# Patient Record
Sex: Male | Born: 1995 | Race: White | Hispanic: No | Marital: Single | Smoking: Never smoker
Health system: Southern US, Community
[De-identification: ages and names within clinical notes are randomized; demographics above are authoritative.]

## PROBLEM LIST (undated history)

## (undated) HISTORY — PX: APPENDECTOMY: SHX54

---
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
## 2015-05-10 ENCOUNTER — Other Ambulatory Visit: Payer: Self-pay | Admitting: Specialist

## 2015-05-10 DIAGNOSIS — N442 Benign cyst of testis: Secondary | ICD-10-CM

## 2015-05-13 ENCOUNTER — Ambulatory Visit
Admission: RE | Admit: 2015-05-13 | Discharge: 2015-05-13 | Disposition: A | Discharge status: Home / Self Care | Payer: BLUE CROSS/BLUE SHIELD | Source: Ambulatory Visit | Attending: Specialist | Admitting: Specialist

## 2015-05-13 DIAGNOSIS — N442 Benign cyst of testis: Secondary | ICD-10-CM | POA: Diagnosis present

## 2016-09-23 IMAGING — US US SCROTUM
2 series · 13 of 25 positions shown · non-contrast
Comparison: None in PACs

CLINICAL DATA: Palpable nontender nodule in the left aspect of
scrotum for the past 5 days.

EXAM:
SCROTAL ULTRASOUND
DOPPLER ULTRASOUND OF THE TESTICLES
TECHNIQUE: Complete ultrasound examination of the testicles, epididymis, and
other scrotal structures was performed. Color and spectral Doppler
ultrasound were also utilized to evaluate blood flow to the
testicles.

[Series 1: us scrotum · 0.07mm/px · 12 of 67 slices shown (1 of 2)]
[im 1/67]
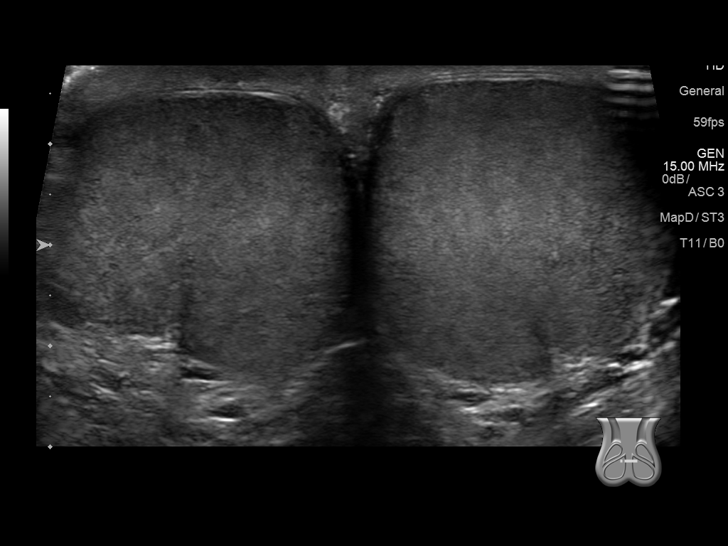
[im 6/67]
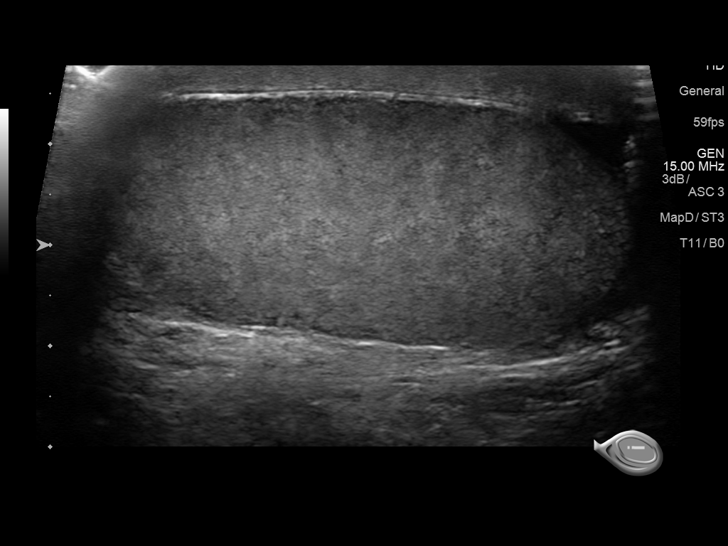
[im 12/67]
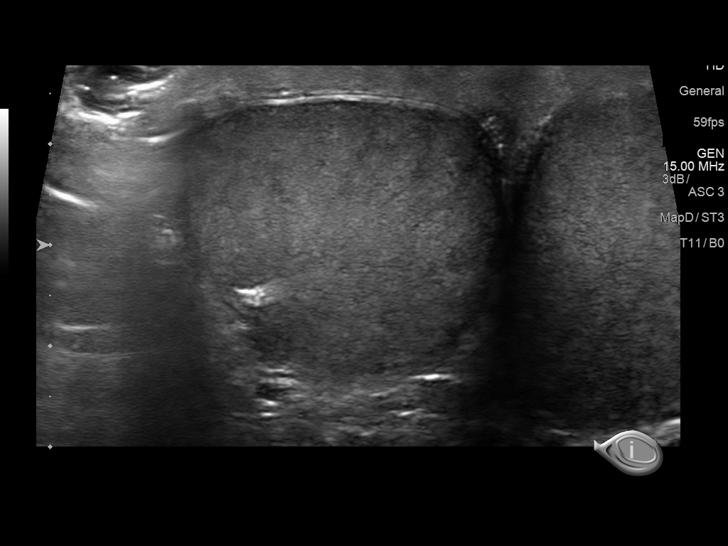
[im 18/67]
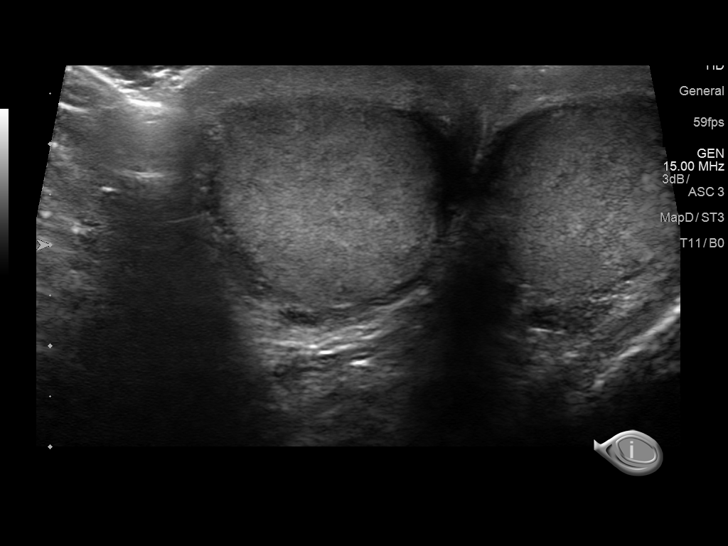
[im 23/67]
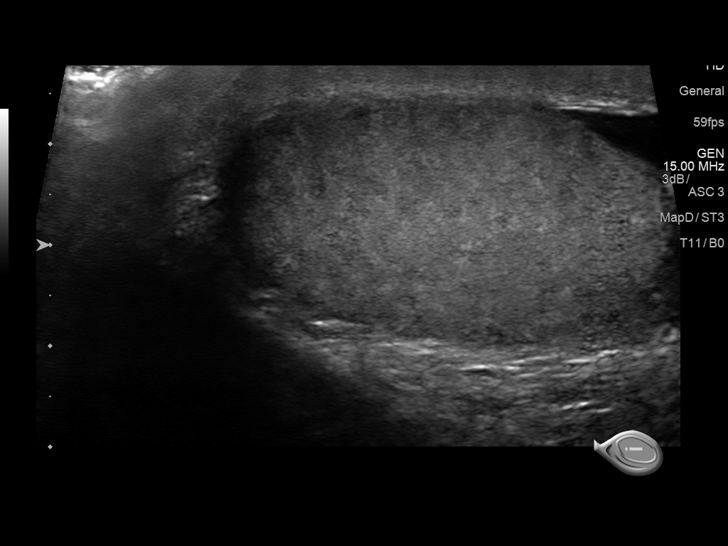
[im 29/67]
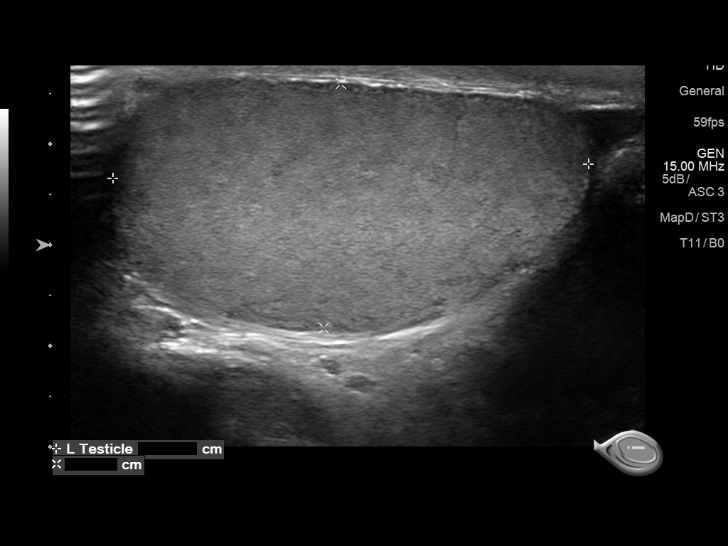
[im 35/67]
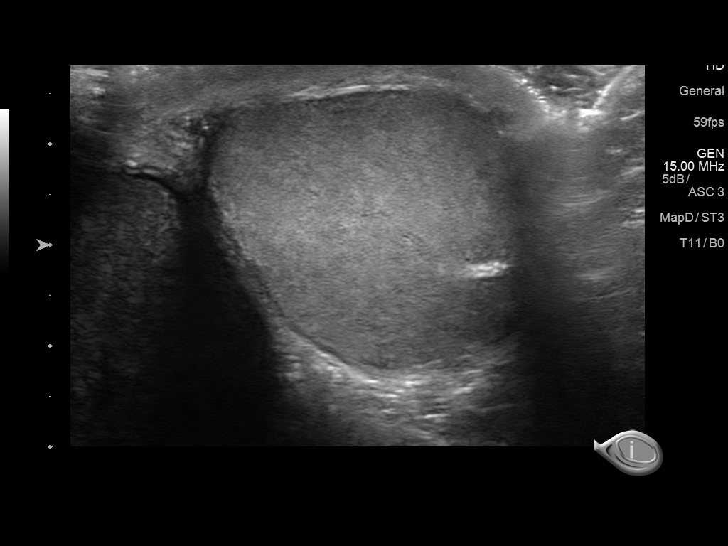
[im 41/67]
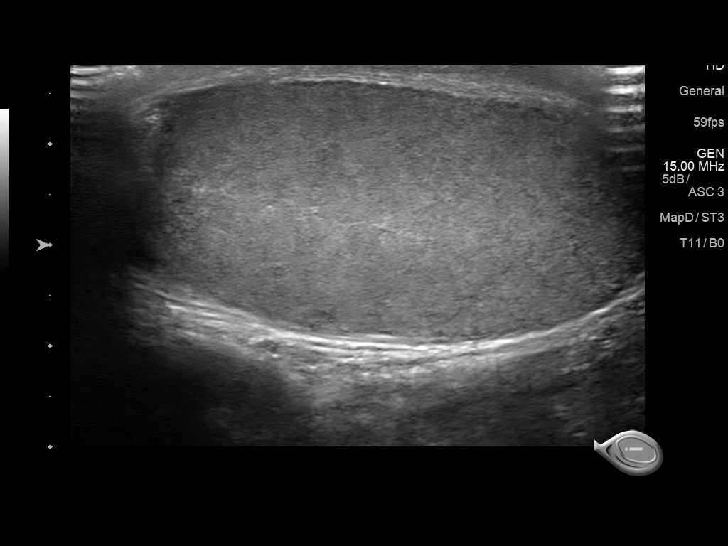
[im 46/67]
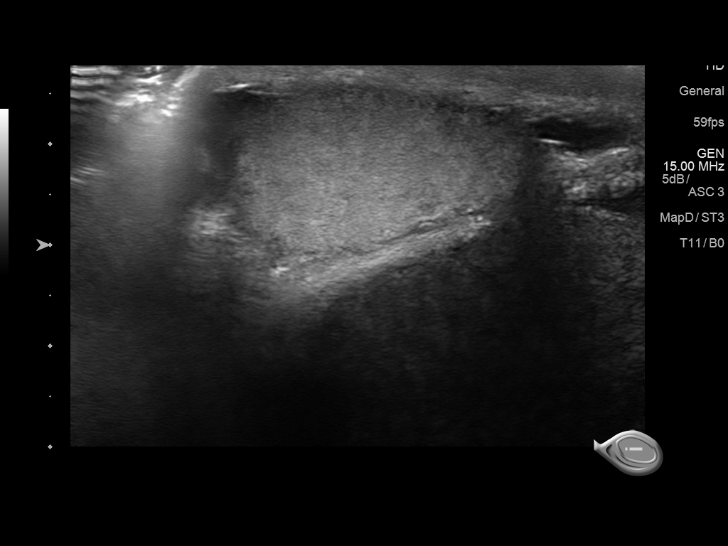
[im 52/67]
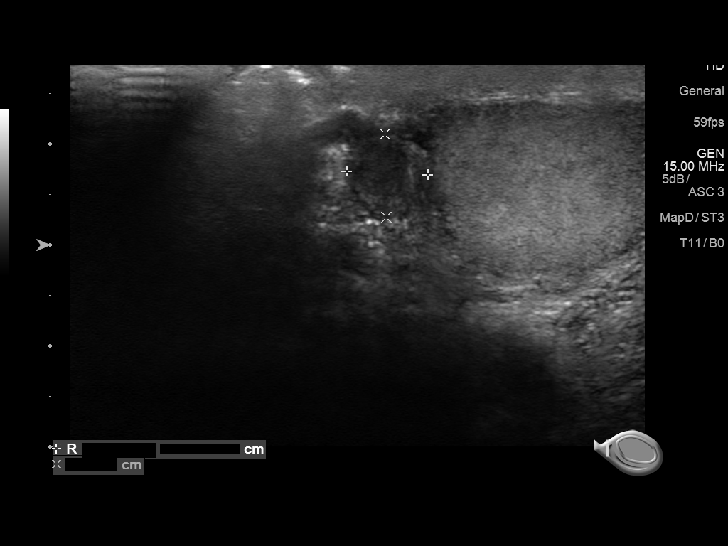
[im 58/67]
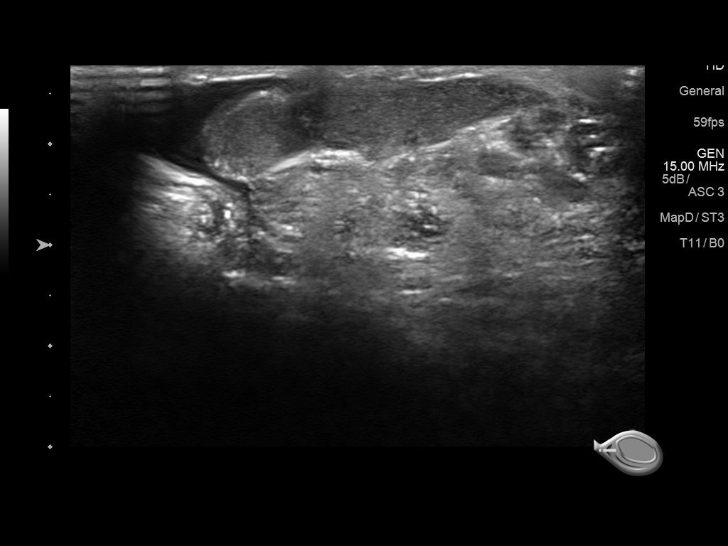
[im 64/67]
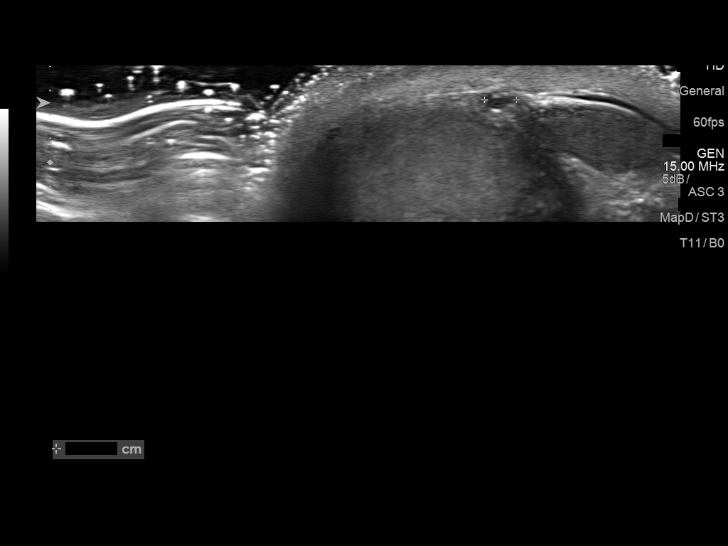

[Series 2: us scrotum · 0.06mm/px · 1 of 1 slices shown (2 of 2)]
[im 1/1]
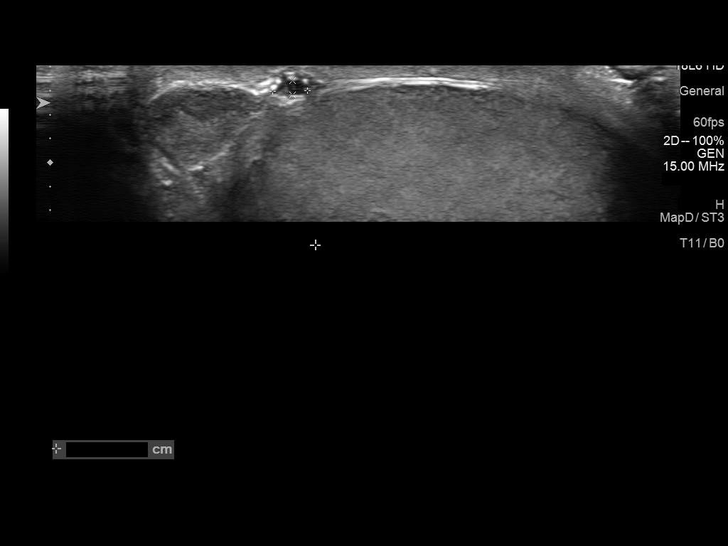

[13 of 25 positions shown; findings below may reference images not displayed]

FINDINGS: Right testicle

Measurements: 5.4 x 2.4 x 3.1 cm. No mass or microlithiasis
visualized.

Left testicle

Measurements: 4.7 x 2.4 x 3.5 cm. No mass or microlithiasis
visualized.

Right epididymis: Normal in size and appearance. There is a 3 mm
diameter anechoic structure likely arising from the epididymis most
compatible with a cyst. Vascularity surrounding this is not
increased. There is no evidence of acute epididymitis.

Left epididymis:  Normal in size and appearance.

Hydrocele:  None visualized.

Varicocele:  None visualized.

Pulsed Doppler interrogation of both testes demonstrates normal low
resistance arterial and venous waveforms bilaterally.
IMPRESSION: 1. There is no intra testicular mass nor evidence of orchitis or
torsion.
2. 3 mm right-sided epididymis which corresponds to the patient's
palpable finding. There is no evidence of acute epididymitis.
3. The left epididymis is unremarkable.

## 2017-06-28 ENCOUNTER — Ambulatory Visit
Admission: EM | Admit: 2017-06-28 | Discharge: 2017-06-28 | Disposition: A | Discharge status: Home / Self Care | Payer: BLUE CROSS/BLUE SHIELD | Attending: Family Medicine | Admitting: Family Medicine

## 2017-06-28 ENCOUNTER — Encounter: Payer: Self-pay | Admitting: *Deleted

## 2017-06-28 DIAGNOSIS — R509 Fever, unspecified: Secondary | ICD-10-CM

## 2017-06-28 DIAGNOSIS — R05 Cough: Secondary | ICD-10-CM

## 2017-06-28 DIAGNOSIS — J111 Influenza due to unidentified influenza virus with other respiratory manifestations: Secondary | ICD-10-CM

## 2017-06-28 DIAGNOSIS — M791 Myalgia, unspecified site: Secondary | ICD-10-CM | POA: Diagnosis not present

## 2017-06-28 DIAGNOSIS — R69 Illness, unspecified: Secondary | ICD-10-CM | POA: Diagnosis not present

## 2017-06-28 DIAGNOSIS — R0981 Nasal congestion: Secondary | ICD-10-CM | POA: Diagnosis not present

## 2017-06-28 MED ORDER — OSELTAMIVIR PHOSPHATE 75 MG PO CAPS: 75.0000 mg | ORAL_CAPSULE | Freq: Two times a day (BID) | ORAL | 0 refills | Status: DC

## 2017-06-28 NOTE — ED Provider Notes (Signed)
[facility] ____________________________________________  Time seen: Approximately 10:05 AM  I have reviewed the triage vital signs and the nursing notes.   HISTORY  Chief Complaint Cough; Fever; and Generalized Body Aches   HPI Ray Nolan is a 22 y.o. male present for evaluation of runny nose, nasal congestion, cough, chills, body aches with accompanying low-grade fever that started last night.  Reports yesterday during the day he felt fine.  States T-max last night was 100.  He does not take any over-the-counter medications for the same complaints.  Reports continues to eat and drink well.  Denies sore throat.  Denies known sick contacts.  Denies recent sickness.  Denies chronic medical problems.  Denies other complaints. Denies recent antibiotic use.    History reviewed. No pertinent past medical history.  There are no active problems to display for this patient.   History reviewed. No pertinent surgical history.   No current facility-administered medications for this encounter.   Current Outpatient Medications:  .  oseltamivir (TAMIFLU) 75 MG capsule, Take 1 capsule (75 mg total) by mouth every 12 (twelve) hours., Disp: 10 capsule, Rfl: 0  Allergies Patient has no known allergies.  Family History  Problem Relation Age of Onset  . Healthy Mother     Social History Social History   Tobacco Use  . Smoking status: Never Smoker  . Smokeless tobacco: Never Used  Substance Use Topics  . Alcohol use: Yes  . Drug use: No    Review of Systems Constitutional:  as above. ENT: No sore throat. Cardiovascular: Denies chest pain. Respiratory: Denies shortness of breath. Gastrointestinal: No abdominal pain.  No nausea, no vomiting.  No diarrhea.   Musculoskeletal: Negative for back pain. Skin: Negative for rash.   ____________________________________________   PHYSICAL EXAM:  VITAL SIGNS: ED Triage Vitals  Enc Vitals Group     BP 06/28/17 0936  115/68     Pulse Rate 06/28/17 0936 78     Resp 06/28/17 0936 16     Temp 06/28/17 0936 98.7 F (37.1 C)     Temp Source 06/28/17 0936 Oral     SpO2 06/28/17 0936 100 %     Weight 06/28/17 0938 185 lb (83.9 kg)     Height 06/28/17 0938 6\' 2"  (1.88 m)     Head Circumference --      Peak Flow --      Pain Score 06/28/17 1007 2     Pain Loc --      Pain Edu? --      Excl. in GC? --    Constitutional: Alert and oriented. Well appearing and in no acute distress. Eyes: Conjunctivae are normal.  Head: Atraumatic. No sinus tenderness to palpation. No swelling. No erythema.  Ears: no erythema, normal TMs bilaterally.   Nose:Nasal congestion   Mouth/Throat: Mucous membranes are moist. No pharyngeal erythema. No tonsillar swelling or exudate.  Neck: No stridor.  No cervical spine tenderness to palpation. Hematological/Lymphatic/Immunilogical: No cervical lymphadenopathy. Cardiovascular: Normal rate, regular rhythm. Grossly normal heart sounds.  Good peripheral circulation. Respiratory: Normal respiratory effort.  No retractions. No wheezes, rales or rhonchi. Good air movement.  Musculoskeletal: Ambulatory with steady gait. No cervical, thoracic or lumbar tenderness to palpation. Neurologic:  Normal speech and language. No gait instability. Skin:  Skin appears warm, dry and intact. No rash noted. Psychiatric: Mood and affect are normal. Speech and behavior are normal.  ___________________________________________   LABS (all labs ordered are listed, but only abnormal results  are displayed)  Labs Reviewed - No data to display ____________________________________________   PROCEDURES Procedures    INITIAL IMPRESSION / ASSESSMENT AND PLAN / ED COURSE  Pertinent labs & imaging results that were available during my care of the patient were reviewed by me and considered in my medical decision making (see chart for details).  Well-appearing patient.  No acute distress.  Suspect  influenza-like illness.  Will treat patient with oral Tamiflu, and discussed with patient.  Encourage rest, fluids, supportive care, over-the-counter cough and congestion medications.  Work note given for today and tomorrow.Discussed indication, risks and benefits of medications with patient.  Discussed follow up with Primary care physician this week as needed. Discussed follow up and return parameters including no resolution or any worsening concerns. Patient verbalized understanding and agreed to plan.   ____________________________________________   FINAL CLINICAL IMPRESSION(S) / ED DIAGNOSES  Final diagnoses:  Influenza-like illness     ED Discharge Orders        Ordered    oseltamivir (TAMIFLU) 75 MG capsule  Every 12 hours     06/28/17 1003       Note: This dictation was prepared with Dragon dictation along with smaller phrase technology. Any transcriptional errors that result from this process are unintentional.         Renford Dills, NP 06/28/17 1023

## 2017-06-28 NOTE — ED Triage Notes (Signed)
Nonj-productive cough, fever, and body aches, onset last night.

## 2017-06-28 NOTE — Discharge Instructions (Signed)
Take medication as prescribed. Rest. Drink plenty of fluids.  ° °Follow up with your primary care physician this week as needed. Return to Urgent care for new or worsening concerns.  ° °

## 2017-07-01 ENCOUNTER — Telehealth: Payer: Self-pay | Admitting: Emergency Medicine

## 2017-07-01 NOTE — Telephone Encounter (Signed)
Called to follow up after patient's recent visit. Patient states he is much better and has returned to work.

## 2017-10-26 ENCOUNTER — Encounter: Payer: Self-pay | Admitting: Emergency Medicine

## 2017-10-26 ENCOUNTER — Other Ambulatory Visit: Payer: Self-pay

## 2017-10-26 ENCOUNTER — Ambulatory Visit
Admission: EM | Admit: 2017-10-26 | Discharge: 2017-10-26 | Disposition: A | Discharge status: Home / Self Care | Payer: BLUE CROSS/BLUE SHIELD | Attending: Nurse Practitioner | Admitting: Nurse Practitioner

## 2017-10-26 DIAGNOSIS — Z113 Encounter for screening for infections with a predominantly sexual mode of transmission: Secondary | ICD-10-CM | POA: Diagnosis not present

## 2017-10-26 LAB — URINALYSIS, ROUTINE W REFLEX MICROSCOPIC
Bilirubin Urine: NEGATIVE
Glucose, UA: NEGATIVE mg/dL
Hgb urine dipstick: NEGATIVE
KETONES UR: NEGATIVE mg/dL
LEUKOCYTES UA: NEGATIVE
NITRITE: NEGATIVE
PH: 7.5 (ref 5.0–8.0)
Protein, ur: NEGATIVE mg/dL
SPECIFIC GRAVITY, URINE: 1.015 (ref 1.005–1.030)

## 2017-10-26 LAB — CHLAMYDIA/NGC RT PCR (ARMC ONLY)
Chlamydia Tr: NOT DETECTED
N gonorrhoeae: NOT DETECTED

## 2017-10-26 NOTE — ED Provider Notes (Signed)
[facility]    CSN: [id number] Arrival date & time: 10/26/17  1740     History   Chief Complaint Chief Complaint  Patient presents with  . blood work    APPT    HPI Ray Nolan is a 22 y.o. male.   History of Present Illness  Ray Nolan is a 22 y.o. male that presents requesting STD testing.  Patient denies any dysuria, penile pain/discharge, genital lesions, testicular pain, scrotal pain, nausea, vomiting, fevers or rash. The patient does not have a history of STD's and admits to multiple sexual partners in the past.  He is heterosexual.  Patient reports frequent condom usage.  The patient has not had any prior STD/HIV testing.        History reviewed. No pertinent past medical history.  There are no active problems to display for this patient.   History reviewed. No pertinent surgical history.     Home Medications    Prior to Admission medications   Medication Sig Start Date End Date Taking? Authorizing Provider  oseltamivir (TAMIFLU) 75 MG capsule Take 1 capsule (75 mg total) by mouth every 12 (twelve) hours. 06/28/17   Renford Dills, NP    Family History Family History  Problem Relation Age of Onset  . Healthy Mother     Social History Social History   Tobacco Use  . Smoking status: Never Smoker  . Smokeless tobacco: Never Used  Substance Use Topics  . Alcohol use: Yes  . Drug use: No     Allergies   Patient has no known allergies.   Review of Systems Review of Systems  Gastrointestinal: Negative for nausea and vomiting.  Genitourinary: Negative for decreased urine volume, difficulty urinating, discharge, dysuria, flank pain, frequency, genital sores, hematuria, penile pain, penile swelling, scrotal swelling, testicular pain and urgency.  Musculoskeletal: Negative for back pain.  All other systems reviewed and are negative.    Physical Exam Triage Vital Signs ED Triage Vitals  Enc Vitals Group     BP 10/26/17 1758  129/72     Pulse Rate 10/26/17 1758 61     Resp 10/26/17 1758 18     Temp 10/26/17 1758 97.8 F (36.6 C)     Temp Source 10/26/17 1758 Oral     SpO2 10/26/17 1758 100 %     Weight 10/26/17 1801 190 lb (86.2 kg)     Height 10/26/17 1801  (1.854 m)     Head Circumference --      Peak Flow --      Pain Score 10/26/17 1800 0     Pain Loc --      Pain Edu? --      Excl. in GC? --    No data found.  Updated Vital Signs BP 129/72 (BP Location: Right Arm)   Pulse 61   Temp 97.8 F (36.6 C) (Oral)   Resp 18   Ht  (1.854 m)   Wt 190 lb (86.2 kg)   SpO2 100%   BMI 25.07 kg/m   Visual Acuity Right Eye Distance:   Left Eye Distance:   Bilateral Distance:    Right Eye Near:   Left Eye Near:    Bilateral Near:     Physical Exam  Constitutional: He is oriented to person, place, and time. He appears well-developed and well-nourished.  Neck: Normal range of motion. Neck supple.  Cardiovascular: Normal rate and regular rhythm.  Pulmonary/Chest: Effort normal and breath  sounds normal.  Musculoskeletal: Normal range of motion.  Neurological: He is alert and oriented to person, place, and time.  Skin: Skin is warm and dry.  Psychiatric: He has a normal mood and affect. His behavior is normal.     UC Treatments / Results  Labs (all labs ordered are listed, but only abnormal results are displayed) Labs Reviewed  CHLAMYDIA/NGC RT PCR ([facility] ONLY)  URINALYSIS, ROUTINE W REFLEX MICROSCOPIC  RPR  HIV ANTIBODY (ROUTINE TESTING)    EKG None  Radiology No results found.  Procedures Procedures (including critical care time)  Medications Ordered in UC Medications - No data to display  Initial Impression / Assessment and Plan / UC Course  I have reviewed the triage vital signs and the nursing notes.  Pertinent labs & imaging results that were available during my care of the patient were reviewed by me and considered in my medical decision making (see chart  for details).     22 year old male requesting STD testing.  Urinalysis negative.  Chlamydia, gonorrhea, syphilis and HIV antibody obtained.  Will notify patient of results once they become available.  Patient advised to continue safe sex practices by using condoms 100% of the time.  Evaluation has revealed no signs of a dangerous process. Discussed diagnosis with patient. Patient aware of their illness, possible red flag symptoms to watch out for and need for close follow up. Patient understands verbal and written discharge instructions. Patient comfortable with plan and disposition.  Patient a clear mental status at this time, good insight into illness (after discussion and teaching) and has clear judgment to make decisions regarding their care.  Documentation was completed with the aid of voice recognition software. Transcription may contain typographical errors.  Final Clinical Impressions(s) / UC Diagnoses   Final diagnoses:  Screening examination for STD (sexually transmitted disease)   Discharge Instructions   None    ED Prescriptions    None     Controlled Substance Prescriptions  Controlled Substance Registry consulted? Not Applicable   Lurline Idol, [place] 10/26/17 1936

## 2017-10-26 NOTE — Discharge Instructions (Addendum)
Testing for gonorrhea, chlamydia, syphilis and HIV was done today.  We will call you with the results as soon as a become available.  Continue good safe sex practices by using condoms 100% of the time.

## 2017-10-26 NOTE — ED Triage Notes (Signed)
Patient stated he need lab work for STD.

## 2017-10-28 LAB — RPR: RPR: NONREACTIVE

## 2017-10-28 LAB — HIV ANTIBODY (ROUTINE TESTING W REFLEX): HIV SCREEN 4TH GENERATION: NONREACTIVE

## 2018-04-05 ENCOUNTER — Encounter: Payer: Self-pay | Admitting: Emergency Medicine

## 2018-04-05 ENCOUNTER — Ambulatory Visit
Admission: EM | Admit: 2018-04-05 | Discharge: 2018-04-05 | Disposition: A | Discharge status: Home / Self Care | Payer: BLUE CROSS/BLUE SHIELD | Attending: Family Medicine | Admitting: Family Medicine

## 2018-04-05 ENCOUNTER — Other Ambulatory Visit: Payer: Self-pay

## 2018-04-05 DIAGNOSIS — J039 Acute tonsillitis, unspecified: Secondary | ICD-10-CM

## 2018-04-05 LAB — RAPID STREP SCREEN (MED CTR MEBANE ONLY): Streptococcus, Group A Screen (Direct): NEGATIVE

## 2018-04-05 MED ORDER — CETIRIZINE HCL 10 MG PO TABS: 10.0000 mg | ORAL_TABLET | Freq: Every day | ORAL | 0 refills | Status: AC

## 2018-04-05 MED ORDER — METHYLPREDNISOLONE 4 MG PO TBPK: ORAL_TABLET | ORAL | 0 refills | Status: AC

## 2018-04-05 NOTE — ED Triage Notes (Signed)
Pt c/o sore throat, and cough. Started about 3-4 days ago. He reports that his tonsils are inflamed.

## 2018-04-05 NOTE — Discharge Instructions (Signed)
Take medications as directed. Warm salt water gargles several times daily. Tylenol or ibuprofen as needed for pain.

## 2018-04-05 NOTE — ED Provider Notes (Signed)
[facility]    CSN: [id number] Arrival date & time: 04/05/18  1659     History   Chief Complaint Chief Complaint  Patient presents with  . Sore Throat    HPI Ray Nolan is a 22 y.o. male.   Subjective:   Ray Nolan is a 21 y.o. male who presents for evaluation of a sore throat. Associated symptoms include dry cough. Onset of symptoms was 4 days ago and has unchanged since that time.  He is drinking plenty of fluids. He has not had recent close exposure to someone with proven streptococcal pharyngitis.  The following portions of the patient's history were reviewed and updated as appropriate: allergies, current medications, past family history, past medical history, past social history, past surgical history and problem list.            History reviewed. No pertinent past medical history.  There are no active problems to display for this patient.   Past Surgical History:  Procedure Laterality Date  . APPENDECTOMY  22 years old       Home Medications    Prior to Admission medications   Medication Sig Start Date End Date Taking? Authorizing Provider  cetirizine (ZYRTEC) 10 MG tablet Take 1 tablet (10 mg total) by mouth daily. 04/05/18   Lurline Idol, FNP  methylPREDNISolone (MEDROL DOSEPAK) 4 MG TBPK tablet Take as directed 04/05/18   Lurline Idol, FNP    Family History Family History  Problem Relation Age of Onset  . Healthy Mother   . Healthy Father     Social History Social History   Tobacco Use  . Smoking status: Never Smoker  . Smokeless tobacco: Never Used  Substance Use Topics  . Alcohol use: Yes  . Drug use: No     Allergies   Patient has no known allergies.   Review of Systems Review of Systems  Constitutional: Negative for fever.  HENT: Positive for sore throat. Negative for congestion, rhinorrhea, sinus pressure, sinus pain, sneezing and trouble swallowing.   Eyes: Negative.   Respiratory: Positive  for cough. Negative for shortness of breath.   Cardiovascular: Negative.   Neurological: Negative for headaches.  All other systems reviewed and are negative.    Physical Exam Triage Vital Signs ED Triage Vitals  Enc Vitals Group     BP 04/05/18 1712 119/69     Pulse Rate 04/05/18 1712 62     Resp 04/05/18 1712 16     Temp 04/05/18 1712 98.1 F (36.7 C)     Temp Source 04/05/18 1712 Oral     SpO2 04/05/18 1712 98 %     Weight 04/05/18 1711 190 lb (86.2 kg)     Height 04/05/18 1711 6\' 1"  (1.854 m)     Head Circumference --      Peak Flow --      Pain Score 04/05/18 1710 8     Pain Loc --      Pain Edu? --      Excl. in GC? --    No data found.  Updated Vital Signs BP 119/69 (BP Location: Left Arm)   Pulse 62   Temp 98.1 F (36.7 C) (Oral)   Resp 16   Ht 6\' 1"  (1.854 m)   Wt 190 lb (86.2 kg)   SpO2 98%   BMI 25.07 kg/m   Visual Acuity Right Eye Distance:   Left Eye Distance:   Bilateral Distance:    Right Eye Near:  Left Eye Near:    Bilateral Near:     Physical Exam  Constitutional: He appears well-developed and well-nourished.  HENT:  Head: Normocephalic.  Right Ear: Tympanic membrane normal.  Left Ear: Tympanic membrane normal.  Mouth/Throat: Uvula is midline and mucous membranes are normal. No oral lesions. No uvula swelling. Posterior oropharyngeal erythema present. No oropharyngeal exudate or posterior oropharyngeal edema. No tonsillar exudate.  Eyes: Pupils are equal, round, and reactive to light. EOM are normal.  Neck: Normal range of motion. Neck supple.  Cardiovascular: Normal rate.  Pulmonary/Chest: Effort normal.  Lymphadenopathy:    He has no cervical adenopathy.  Neurological: He is alert.  Skin: Skin is warm and dry.  Psychiatric: He has a normal mood and affect. His behavior is normal.     UC Treatments / Results  Labs (all labs ordered are listed, but only abnormal results are displayed) Labs Reviewed  RAPID STREP SCREEN (MED  CTR [facility] ONLY)  CULTURE, GROUP A STREP [facility])    EKG None  Radiology No results found.  Procedures Procedures (including critical care time)  Medications Ordered in UC Medications - No data to display  Initial Impression / Assessment and Plan / UC Course  I have reviewed the triage vital signs and the nursing notes.  Pertinent labs & imaging results that were available during my care of the patient were reviewed by me and considered in my medical decision making (see chart for details).     22 yo male presenting with a 4-day history of sore throat and dry cough. No fevers. Patient is non-toxic appearing. Vital signs stable. Rapid strep negative. Throat cultures pending.  Plan:  Use of OTC analgesics recommended as well as salt water gargles. Zyrtec daily  Medrol dose pack  Follow up as needed.  Today's evaluation has revealed no signs of a dangerous process. Discussed diagnosis with patient. Patient aware of their diagnosis, possible red flag symptoms to watch out for and need for close follow up. Patient understands verbal and written discharge instructions. Patient comfortable with plan and disposition.  Patient has a clear mental status at this time, good insight into illness (after discussion and teaching) and has clear judgment to make decisions regarding their care.  Documentation was completed with the aid of voice recognition software. Transcription may contain typographical errors.  Final Clinical Impressions(s) / UC Diagnoses   Final diagnoses:  Tonsillitis     Discharge Instructions     Take medications as directed. Warm salt water gargles several times daily. Tylenol or ibuprofen as needed for pain.     ED Prescriptions    Medication Sig Dispense Auth. Provider   methylPREDNISolone (MEDROL DOSEPAK) 4 MG TBPK tablet Take as directed 21 tablet Lurline Idol, FNP   cetirizine (ZYRTEC) 10 MG tablet Take 1 tablet (10 mg total) by mouth daily. 14 tablet  Lurline Idol, FNP     Controlled Substance Prescriptions [place] Controlled Substance Registry consulted? Not Applicable   Lurline Idol, FNP 04/05/18 1740

## 2018-04-08 LAB — CULTURE, GROUP A STREP (THRC)

## 2019-03-02 ENCOUNTER — Ambulatory Visit
Admission: EM | Admit: 2019-03-02 | Discharge: 2019-03-02 | Disposition: A | Discharge status: Home / Self Care | Payer: BC Managed Care – PPO | Attending: Internal Medicine | Admitting: Internal Medicine

## 2019-03-02 ENCOUNTER — Encounter: Payer: Self-pay | Admitting: Emergency Medicine

## 2019-03-02 ENCOUNTER — Other Ambulatory Visit: Payer: Self-pay

## 2019-03-02 DIAGNOSIS — R300 Dysuria: Secondary | ICD-10-CM | POA: Diagnosis not present

## 2019-03-02 DIAGNOSIS — Z113 Encounter for screening for infections with a predominantly sexual mode of transmission: Secondary | ICD-10-CM | POA: Diagnosis not present

## 2019-03-02 NOTE — ED Triage Notes (Addendum)
Patient here today for STD check. Per patient condom broke and partner is showing some symptoms. Also patient states that it is painful to urinate.

## 2019-03-02 NOTE — ED Provider Notes (Signed)
[facility] ____________________________________________  Time seen: Approximately 3:40 PM  I have reviewed the triage vital signs and the nursing notes.   HISTORY  Chief Complaint No chief complaint on file.   HPI Ray Nolan is a 23 y.o. male presenting for evaluation of possible STD.  Patient reports he had one episode of burning with urination but he has not had any since.  Patient does report he had a recent new sexual partner in which during sexual encounter condom broke raising his concern.  However patient also reports frequent irritation by masturbation and unsure if this is a contributing factor.  Patient states that he was seen in urgent care in [facility] yesterday for the same complaints and was given "four pink pills and a shot "for concern of gonorrhea or chlamydia.  Patient states coming in today as he is anxious and he does not feel like he received much education information from the urgent care yesterday.  Patient denies any other episode of burning with urination.  Has not had any recurrence of sexual activity since yesterday. Denies any penile discharge, penile or testicular pain or swelling, rash or sores, abdominal pain, back pain or fevers.  States currently feels fine but is anxious.  No known history of herpes.  States his sexual partner was having some vaginal discomfort and she was getting seen and evaluated today as well.  Denies history of same.   History reviewed. No pertinent past medical history.  There are no active problems to display for this patient.   Past Surgical History:  Procedure Laterality Date  . APPENDECTOMY  23 years old     No current facility-administered medications for this encounter.   Current Outpatient Medications:  .  cetirizine (ZYRTEC) 10 MG tablet, Take 1 tablet (10 mg total) by mouth daily., Disp: 14 tablet, Rfl: 0 .  methylPREDNISolone (MEDROL DOSEPAK) 4 MG TBPK tablet, Take as directed, Disp: 21 tablet, Rfl: 0  Allergies Patient has no known allergies.  Family History  Problem Relation Age of Onset  . Healthy Mother   . Healthy Father     Social History Social History   Tobacco Use  . Smoking status: Never Smoker  . Smokeless tobacco: Never Used  Substance Use Topics  . Alcohol use: Yes  . Drug use: No    Review of Systems Constitutional: No fever. Cardiovascular: Denies chest pain. Respiratory: Denies shortness of breath. Gastrointestinal: No abdominal pain.  Genitourinary: As above.  Musculoskeletal: Negative for back pain. Skin: Negative for rash.   ____________________________________________   PHYSICAL EXAM:  VITAL SIGNS: ED Triage Vitals  Enc Vitals Group     BP 03/02/19 1505 136/88     Pulse Rate 03/02/19 1505 77     Resp 03/02/19 1505 18     Temp 03/02/19 1505 98.2 F (36.8 C)     Temp src --      SpO2 03/02/19 1505 100 %     Weight 03/02/19 1503 195 lb (88.5 kg)     Height 03/02/19 1503 6\' 1"  (1.854 m)     Head Circumference --      Peak Flow --      Pain Score 03/02/19 1502 4     Pain Loc --      Pain Edu? --      Excl. in GC? --     Constitutional: Alert and oriented. Well appearing and in no acute distress. Eyes: Conjunctivae are normal.  ENT  Head: Normocephalic and atraumatic. Cardiovascular: Normal rate, regular rhythm. Grossly normal heart sounds.  Good peripheral circulation. Respiratory: Normal respiratory effort without tachypnea nor retractions. Breath sounds are clear and equal bilaterally. No wheezes, rales, rhonchi. Gastrointestinal: Soft and nontender.No CVA tenderness. Musculoskeletal: No midline cervical, thoracic or lumbar tenderness to palpation.  Neurologic:  Normal speech and language. Speech is normal. No gait instability.  Skin:  Skin is warm, dry and intact. No rash noted. Psychiatric: Mood and affect are normal. Speech and behavior are normal. Patient exhibits appropriate insight and judgment    ___________________________________________   LABS (all labs ordered are listed, but only abnormal results are displayed)  Labs Reviewed  GC/CHLAMYDIA PROBE AMP    PROCEDURES Procedures    INITIAL IMPRESSION / ASSESSMENT AND PLAN / ED COURSE  Pertinent labs & imaging results that were available during my care of the patient were reviewed by me and considered in my medical decision making (see chart for details).  Very well-appearing patient but no acute distress.  He is anxious and expresses this.  After long conversation with patient patient reports feeling better.  Patient had single episode of dysuria, discussed possibility of STD versus urethral irritation second to masturbation.  Patient denies any continued complaints or any current symptoms.  Patient requested to repeat gonorrhea and Chlamydia testing today.  However in further discussion with patient and asked if patient would like to have further STD testing, patient declined.  Patient states he wants to currently wait for chlamydia testing as well as partners STD testing and then follow-up for further testing if needed.  Also discussed with patient to call previous urgent care to ensure that patient received appropriate treatment of 1 g azithromycin and 250 mg IM Rocephin.  Safe sex. Discussed no sexual activity for at least 1 week.  Discussed follow up with Primary care physician this week as needed. Discussed follow up and return parameters including no resolution or any worsening concerns. Patient verbalized understanding and agreed to plan.   ____________________________________________   FINAL CLINICAL IMPRESSION(S) / ED DIAGNOSES  Final diagnoses:  Screen for STD (sexually transmitted disease)  Dysuria     ED Discharge Orders    None       Note: This dictation was prepared with Dragon dictation along with smaller phrase technology. Any transcriptional errors that result from this process are unintentional.          Marylene Land, NP 03/02/19 1548

## 2019-03-02 NOTE — Discharge Instructions (Signed)
Monitor. As discussed, ensure you had correct treatment of rocephin 250mg  in shot form into muscle, and azithromycin 1000mg  orally.  Follow up with your primary care physician this week as needed. Return to Urgent care for new or worsening concerns.

## 2019-03-04 ENCOUNTER — Telehealth: Payer: Self-pay | Admitting: Emergency Medicine

## 2019-03-04 LAB — GC/CHLAMYDIA PROBE AMP
Chlamydia trachomatis, NAA: NEGATIVE
Neisseria Gonorrhoeae by PCR: NEGATIVE

## 2019-03-04 NOTE — Telephone Encounter (Signed)
Patient called asking for his test results.  Patient was notified that his GC/Chlamydia test were both Negative.  Patient verbalized understanding.

## 2019-03-08 ENCOUNTER — Other Ambulatory Visit: Payer: Self-pay

## 2019-03-08 ENCOUNTER — Ambulatory Visit
Admission: EM | Admit: 2019-03-08 | Discharge: 2019-03-08 | Disposition: A | Discharge status: Home / Self Care | Payer: BC Managed Care – PPO | Attending: Family Medicine | Admitting: Family Medicine

## 2019-03-08 DIAGNOSIS — R300 Dysuria: Secondary | ICD-10-CM | POA: Diagnosis not present

## 2019-03-08 DIAGNOSIS — N489 Disorder of penis, unspecified: Secondary | ICD-10-CM | POA: Diagnosis not present

## 2019-03-08 LAB — URINALYSIS, COMPLETE (UACMP) WITH MICROSCOPIC
Bacteria, UA: NONE SEEN
Bilirubin Urine: NEGATIVE
Glucose, UA: NEGATIVE mg/dL
Hgb urine dipstick: NEGATIVE
Ketones, ur: NEGATIVE mg/dL
Leukocytes,Ua: NEGATIVE
Nitrite: NEGATIVE
Protein, ur: NEGATIVE mg/dL
RBC / HPF: NONE SEEN RBC/hpf (ref 0–5)
Specific Gravity, Urine: 1.015 (ref 1.005–1.030)
Squamous Epithelial / LPF: NONE SEEN (ref 0–5)
pH: 7.5 (ref 5.0–8.0)

## 2019-03-08 MED ORDER — VALACYCLOVIR HCL 1 G PO TABS: 1000.0000 mg | ORAL_TABLET | Freq: Two times a day (BID) | ORAL | 0 refills | Status: AC

## 2019-03-08 NOTE — ED Triage Notes (Signed)
Pt with pain to tip of penis and in middle of testicles. Also burning with urination. Had STD testing done last week and all was negative.

## 2019-03-08 NOTE — ED Provider Notes (Signed)
[facility]    CSN: [id number] Arrival date & time: 03/08/19  1129      History   Chief Complaint Chief Complaint  Patient presents with  . Dysuria    HPI Ray Nolan is a 23 y.o. male.   23 yo male with a c/o pain to the tip of his penis and middle of his testicles as well as burning with urination. Patient was seen last week and STD tests were negative. Had recently (prior to last week) been treated for possible STD with zithromax and rocephin. Denies any fevers, chills.  States that after he was seen here last week, he developed some skin lesions on his penis that "look like blackheads". Denies any pain to the lesions or penile discharge. He states that his girlfriend was tested and diagnosed with HSV 1 last week due to some vaginal lesions and was treated with Valtrex. 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    Dysuria Presenting symptoms: dysuria     History reviewed. No pertinent past medical history.  There are no active problems to display for this patient.   Past Surgical History:  Procedure Laterality Date  . APPENDECTOMY  23 years old       Home Medications    Prior to Admission medications   Medication Sig Start Date End Date Taking? Authorizing Provider  cetirizine (ZYRTEC) 10 MG tablet Take 1 tablet (10 mg total) by mouth daily. 04/05/18   Lurline Idol, FNP  glycopyrrolate (ROBINUL) 1 MG tablet  03/03/19   [provider]  methylPREDNISolone (MEDROL DOSEPAK) 4 MG TBPK tablet Take as directed 04/05/18   Lurline Idol, FNP  valACYclovir (VALTREX) 1000 MG tablet Take 1 tablet (1,000 mg total) by mouth 2 (two) times daily. 03/08/19   Payton Mccallum, MD    Family History Family History  Problem Relation Age of Onset  . Healthy Mother   . Healthy Father     Social History Social History   Tobacco Use  . Smoking status: Never Smoker  . Smokeless tobacco: Never Used  Substance Use Topics  . Alcohol use: Yes     Comment: social  . Drug use: No     Allergies   Patient has no known allergies.   Review of Systems Review of Systems  Genitourinary: Positive for dysuria.     Physical Exam Triage Vital Signs ED Triage Vitals  Enc Vitals Group     BP 03/08/19 1138 129/76     Pulse Rate 03/08/19 1138 61     Resp 03/08/19 1138 16     Temp 03/08/19 1138 98.2 F (36.8 C)     Temp Source 03/08/19 1138 Oral     SpO2 03/08/19 1138 100 %     Weight 03/08/19 1139 195 lb (88.5 kg)     Height 03/08/19 1139 6\' 1"  (1.854 m)     Head Circumference --      Peak Flow --      Pain Score 03/08/19 1139 0     Pain Loc --      Pain Edu? --      Excl. in GC? --    No data found.  Updated Vital Signs BP 129/76 (BP Location: Left Arm)   Pulse 61   Temp 98.2 F (36.8 C) (Oral)   Resp 16   Ht 6\' 1"  (1.854 m)   Wt 88.5 kg   SpO2 100%   BMI 25.73 kg/m   Visual  Acuity Right Eye Distance:   Left Eye Distance:   Bilateral Distance:    Right Eye Near:   Left Eye Near:    Bilateral Near:     Physical Exam Vitals signs and nursing note reviewed.  Constitutional:      General: He is not in acute distress.    Appearance: He is not toxic-appearing or diaphoretic.  Genitourinary:    Penis: Lesions (multiple approximately 1-63mm scabbed lesions with mild surrounding erythema on the ventral aspect of penis skin; no drainage) present.      Scrotum/Testes: Normal.  Lymphadenopathy:     Lower Body: No right inguinal adenopathy. No left inguinal adenopathy.  Neurological:     Mental Status: He is alert.      UC Treatments / Results  Labs (all labs ordered are listed, but only abnormal results are displayed) Labs Reviewed  URINALYSIS, COMPLETE (UACMP) WITH MICROSCOPIC    EKG   Radiology No results found.  Procedures Procedures (including critical care time)  Medications Ordered in UC Medications - No data to display  Initial Impression / Assessment and Plan / UC Course  I have  reviewed the triage vital signs and the nursing notes.  Pertinent labs & imaging results that were available during my care of the patient were reviewed by me and considered in my medical decision making (see chart for details).      Final Clinical Impressions(s) / UC Diagnoses   Final diagnoses:  Penile lesion  Dysuria    ED Prescriptions    Medication Sig Dispense Auth. Provider   valACYclovir (VALTREX) 1000 MG tablet Take 1 tablet (1,000 mg total) by mouth 2 (two) times daily. 14 tablet Norval Gable, MD      1. Lab results and diagnosis reviewed with patient 2. rx as per orders above; reviewed possible side effects, interactions, risks and benefits  3. Follow-up prn if symptoms worsen or don't improve   PDMP not reviewed this encounter.   Norval Gable, MD 03/08/19 [phone]

## 2021-05-26 ENCOUNTER — Other Ambulatory Visit: Payer: Self-pay

## 2021-05-26 ENCOUNTER — Ambulatory Visit
Admission: RE | Admit: 2021-05-26 | Discharge: 2021-05-26 | Disposition: A | Discharge status: Home / Self Care | Payer: Self-pay | Source: Ambulatory Visit | Attending: Family Medicine | Admitting: Family Medicine

## 2021-05-26 VITALS — BP 134/89 | HR 71 | Temp 98.5°F | Resp 16

## 2021-05-26 DIAGNOSIS — Z4802 Encounter for removal of sutures: Secondary | ICD-10-CM

## 2021-05-26 NOTE — Discharge Instructions (Addendum)
Go home and trim your beard and wash the remaining crusty drainage from your skin.  Do so gently so as to not disrupt the healing tissue.  Return for reevaluation for new or worsening symptoms.
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
